# Patient Record
Sex: Female | Born: 1955 | Race: White | Hispanic: No | Marital: Married | Smoking: Never smoker
Health system: Southern US, Community
[De-identification: ages and names within clinical notes are randomized; demographics above are authoritative.]

## PROBLEM LIST (undated history)

## (undated) DIAGNOSIS — E119 Type 2 diabetes mellitus without complications: Secondary | ICD-10-CM

## (undated) DIAGNOSIS — I10 Essential (primary) hypertension: Secondary | ICD-10-CM

## (undated) DIAGNOSIS — Z9889 Other specified postprocedural states: Secondary | ICD-10-CM

## (undated) DIAGNOSIS — E7800 Pure hypercholesterolemia, unspecified: Secondary | ICD-10-CM

## (undated) DIAGNOSIS — T8859XA Other complications of anesthesia, initial encounter: Secondary | ICD-10-CM

## (undated) DIAGNOSIS — M1990 Unspecified osteoarthritis, unspecified site: Secondary | ICD-10-CM

## (undated) DIAGNOSIS — R112 Nausea with vomiting, unspecified: Secondary | ICD-10-CM

## (undated) DIAGNOSIS — T4145XA Adverse effect of unspecified anesthetic, initial encounter: Secondary | ICD-10-CM

## (undated) DIAGNOSIS — T7840XA Allergy, unspecified, initial encounter: Secondary | ICD-10-CM

## (undated) DIAGNOSIS — E039 Hypothyroidism, unspecified: Secondary | ICD-10-CM

## (undated) DIAGNOSIS — N6310 Unspecified lump in the right breast, unspecified quadrant: Secondary | ICD-10-CM

## (undated) DIAGNOSIS — K219 Gastro-esophageal reflux disease without esophagitis: Secondary | ICD-10-CM

## (undated) HISTORY — PX: DILATION AND CURETTAGE OF UTERUS: SHX78

## (undated) HISTORY — PX: BREAST SURGERY: SHX581

## (undated) HISTORY — PX: TUBAL LIGATION: SHX77

## (undated) HISTORY — PX: LIPOMA EXCISION: SHX5283

## (undated) HISTORY — PX: BACK SURGERY: SHX140

---
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
## 2008-01-29 ENCOUNTER — Ambulatory Visit (HOSPITAL_COMMUNITY): Admission: RE | Admit: 2008-01-29 | Discharge: 2008-01-30 | Payer: Self-pay | Admitting: Neurosurgery

## 2008-01-29 ENCOUNTER — Encounter (INDEPENDENT_AMBULATORY_CARE_PROVIDER_SITE_OTHER): Payer: Self-pay | Admitting: Neurosurgery

## 2010-09-19 NOTE — Op Note (Signed)
Carrie Pope, Carrie Pope                ACCOUNT NO.:  [id number]   MEDICAL RECORD NO.:  [id number]          PATIENT TYPE:  OIB   LOCATION:  3534                         FACILITY:  [facility]   PHYSICIAN:  Clydene Fake, M.D.  DATE OF BIRTH:  09/10/1955   DATE OF PROCEDURE:  01/29/2008  DATE OF DISCHARGE:                               OPERATIVE REPORT   PREOPERATIVE DIAGNOSIS:  Herniated nucleus pulposus, right L5-S1.   POSTOPERATIVE DIAGNOSIS:  Herniated nucleus pulposus, right L5-S1.   PROCEDURE:  Right L5-S1 semi-hemilaminectomy and diskectomy,  microdissection with microscope.   SURGEON:  Clydene Fake, MD   ANESTHESIA:  General endotracheal tube anesthesia.   ESTIMATED BLOOD LOSS:  Minimal.   BLOOD GIVEN:  None.   DRAINS:  None.   COMPLICATIONS:  None.   REASON FOR PROCEDURE:  The patient is a 55 year old woman who has been  having back and right leg pain, numbness, also found to some weakness in  right plantar flexion and absent reflex of right ankle.  MRI was done  showing very large disk herniation going caudally L5-S1 causing nerve  compression.  The patient was brought in for decompression.   PROCEDURE IN DETAIL:  The patient was brought in the operating room and  general anesthesia was induced.  The patient was placed in a prone  position on a Wilson frame with all pressure points padded.  The patient  was prepped and draped in sterile fashion.  The site of incision was  injected with 10 mL of 1% lidocaine with epinephrine.  Needle was placed  interspace.  X-ray was obtained showing needle was at the interspace.  Incision was then made at the center where the needle was inserted and  was taken down to fascia and hemostasis was obtained with Bovie  cauterization.  The fascia was incised with the Bovie and subperiosteal  dissection was done over the L5-S1 spinous process and lamina up to the  facet.  Self-retaining retractors were placed.  Marker was placed in the  interspace.  X-ray was obtained showing the marker was at the 5-1  interspace.  A self-retaining retractor was placed and microscope was  brought in for microdissection.  At this point, a high-speed drill was  used to start semihemilaminectomy and medial facetectomy.  It was  completed with Kerrison punches, ligamentum flavum was removed, and  exposed the dura and S1 nerve roots.  A large mass was seen coming  through the axilla between the thecal sac and S1 nerve roots, so we  extended our laminotomy over the sacrum exposing this area more.  We  dissected through this mass and first some gelatinous-like material that  could have been a very edematous, disk was obtained, and we collected  this and sent for pathology as we continued removing the mass, it became  clear that this was an extruded fragment of disk and these pieces also  were capped and we sent a good amount of specimen to pathology just  after this.  We continued the diskectomy through the axilla.  There was  thin membrane or scar  over the nerve root and we dissected through that  and as we did that, we loosened up the S1 nerve root which was a bit  edematous.  We were able to retract the nerve root medially and explored  the disk space.  There was a central annular tear that we could not very  well reach but we were able to remove the rest of free fragmented disk  and we used bipolar 2-0 Vicryl over the annulus.  We did not enter the  disk space.  When we were finished, we had good decompression of the  central canal and the L5-S1 nerve root.  Hemostasis with bipolar  cauterization, Gelfoam, and thrombin.  Gelfoam was irrigated out.  We  irrigated antibiotic solution.  Then we placed Depo-Medrol in the  epidural space.  Retractors were removed.  Fascia closed with 0 Vicryl  interrupted sutures.  Subcutaneous tissue closed with 2-0 and 3-0 Vicryl  interrupted sutures.  Skin closed with Dermabond skin glue and dry  sterile  dressing was placed.  The patient was placed back in the supine  position, awoken from anesthesia, and transferred to recovery room in  stable condition.           ______________________________  Clydene Fake, M.D.     JRH/MEDQ  D:  01/29/2008  T:  01/30/2008  Job:  [id number]

## 2011-02-05 LAB — BASIC METABOLIC PANEL
BUN: 15
CO2: 27
Chloride: 104
Potassium: 3.9

## 2011-02-05 LAB — URINALYSIS, ROUTINE W REFLEX MICROSCOPIC
Glucose, UA: NEGATIVE
pH: 8

## 2011-02-05 LAB — URINE MICROSCOPIC-ADD ON

## 2011-02-05 LAB — CBC
HCT: 41
MCV: 90.2
Platelets: 243
RBC: 4.55
WBC: 8.5

## 2011-02-05 LAB — GLUCOSE, CAPILLARY
Glucose-Capillary: 132 — ABNORMAL HIGH
Glucose-Capillary: 99

## 2019-05-20 ENCOUNTER — Other Ambulatory Visit: Payer: Self-pay | Admitting: Family Medicine

## 2019-05-20 DIAGNOSIS — N6489 Other specified disorders of breast: Secondary | ICD-10-CM

## 2019-06-01 ENCOUNTER — Ambulatory Visit
Admission: RE | Admit: 2019-06-01 | Discharge: 2019-06-01 | Disposition: A | Discharge status: Home / Self Care | Payer: BC Managed Care – PPO | Source: Ambulatory Visit | Attending: Family Medicine | Admitting: Family Medicine

## 2019-06-01 ENCOUNTER — Other Ambulatory Visit: Payer: Self-pay

## 2019-06-01 DIAGNOSIS — N6489 Other specified disorders of breast: Secondary | ICD-10-CM

## 2019-06-17 ENCOUNTER — Other Ambulatory Visit: Payer: Self-pay | Admitting: Surgery

## 2019-06-17 DIAGNOSIS — N6021 Fibroadenosis of right breast: Secondary | ICD-10-CM

## 2019-06-26 ENCOUNTER — Other Ambulatory Visit: Payer: Self-pay | Admitting: Surgery

## 2019-06-26 DIAGNOSIS — N6021 Fibroadenosis of right breast: Secondary | ICD-10-CM

## 2019-07-27 ENCOUNTER — Other Ambulatory Visit: Payer: Self-pay

## 2019-07-27 ENCOUNTER — Encounter (HOSPITAL_BASED_OUTPATIENT_CLINIC_OR_DEPARTMENT_OTHER): Payer: Self-pay | Admitting: Surgery

## 2019-08-01 ENCOUNTER — Other Ambulatory Visit (HOSPITAL_COMMUNITY): Payer: BC Managed Care – PPO

## 2019-08-03 ENCOUNTER — Other Ambulatory Visit (HOSPITAL_COMMUNITY)
Admission: RE | Admit: 2019-08-03 | Discharge: 2019-08-03 | Disposition: A | Discharge status: Home / Self Care | Payer: BC Managed Care – PPO | Source: Ambulatory Visit | Attending: Surgery | Admitting: Surgery

## 2019-08-03 ENCOUNTER — Encounter (HOSPITAL_BASED_OUTPATIENT_CLINIC_OR_DEPARTMENT_OTHER)
Admission: RE | Admit: 2019-08-03 | Discharge: 2019-08-03 | Disposition: A | Discharge status: Home / Self Care | Payer: BC Managed Care – PPO | Source: Ambulatory Visit | Attending: Surgery | Admitting: Surgery

## 2019-08-03 DIAGNOSIS — Z20822 Contact with and (suspected) exposure to covid-19: Secondary | ICD-10-CM | POA: Insufficient documentation

## 2019-08-03 DIAGNOSIS — Z01812 Encounter for preprocedural laboratory examination: Secondary | ICD-10-CM | POA: Diagnosis not present

## 2019-08-03 LAB — BASIC METABOLIC PANEL
Anion gap: 13 (ref 5–15)
BUN: 12 mg/dL (ref 8–23)
CO2: 27 mmol/L (ref 22–32)
Calcium: 9.1 mg/dL (ref 8.9–10.3)
Chloride: 99 mmol/L (ref 98–111)
Creatinine, Ser: 0.69 mg/dL (ref 0.44–1.00)
GFR calc Af Amer: >60 mL/min (ref >60)
GFR calc non Af Amer: >60 mL/min (ref >60)
Glucose, Bld: 148 mg/dL — ABNORMAL HIGH (ref 70–99)
Potassium: 4 mmol/L (ref 3.5–5.1)
Sodium: 139 mmol/L (ref 135–145)

## 2019-08-03 LAB — SARS CORONAVIRUS 2 (TAT 6-24 HRS): SARS Coronavirus 2: NEGATIVE

## 2019-08-03 MED ORDER — ENSURE PRE-SURGERY PO LIQD: 296.0000 mL | Freq: Once | ORAL | Status: DC

## 2019-08-03 NOTE — Progress Notes (Signed)

## 2019-08-03 NOTE — Progress Notes (Signed)
EKG reviewed by Dr. Witman, will proceed with surgery as scheduled. 

## 2019-08-04 ENCOUNTER — Other Ambulatory Visit: Payer: Self-pay

## 2019-08-04 ENCOUNTER — Ambulatory Visit
Admission: RE | Admit: 2019-08-04 | Discharge: 2019-08-04 | Disposition: A | Discharge status: Home / Self Care | Payer: BC Managed Care – PPO | Source: Ambulatory Visit | Attending: Surgery | Admitting: Surgery

## 2019-08-04 DIAGNOSIS — N6021 Fibroadenosis of right breast: Secondary | ICD-10-CM

## 2019-08-04 NOTE — H&P (Signed)
Carrie Pope  Location: [facility] Patient #: [id number] DOB: 1955-12-29 Married / Language: English / Race: White Female   History of Present Illness  The patient is a 64 year old female who presents with a complaint of Breast problems. This patient is referred by Dr. Dimas Aguas after the recent diagnosis of an abnormality in the right breast. She had a architectural distortion seen on recent for the right breast. She underwent diagnostic films and a stereotactic biopsy which showed a complex sclerosing lesion. Surgical excision of this area is highly recommended. She's had a previous benign biopsy in the left breast many years ago. She has no family history of breast cancer. She is otherwise fairly healthy and without complaints. She denies nipple discharge or any problems with her breasts.   Past Surgical History  Breast Biopsy  Left. Spinal Surgery - Lower Back   Diagnostic Studies History   Colonoscopy  >10 years ago Mammogram  within last year Pap Smear  >5 years ago  Allergies Doristine Devoid, CMA;  No Known Drug Allergies [06/17/2019]:  Medication History Doristine Devoid, CMA;  Atorvastatin Calcium (40MG  Tablet, Oral) Active. Levothyroxine Sodium ( Tablet, Oral) Active. Losartan Potassium-HCTZ (100-25MG  Tablet, Oral) Active. metFORMIN HCl ER (500MG  Tablet ER 24HR, Oral) Active. Metoprolol Succinate ER (50MG  Tablet ER 24HR, Oral) Active. Trulicity (1.5MG /0.5ML Soln Pen-inj, Subcutaneous) Active. Magnesium (250MG  Tablet, Oral) Active. Calcium (600-200MG -UNIT Tablet, Oral) Active. CoQ10 (50MG  Capsule, Oral) Active. St Johns Wort (300MG  Capsule, Oral) Active. Medications Reconciled  Social History , CMA; Alcohol use  Occasional alcohol use. Caffeine use  Coffee, Tea. Illicit drug use  Remotely quit drug use. Tobacco use  Former smoker.  Family History , CMA; Anesthetic complications  Sister. Diabetes  Mellitus  Mother. Heart Disease  Father. Heart disease in female family member before age 21  Melanoma  Sister. Thyroid problems  Brother, Sister.  Pregnancy / Birth History , CMA; Age at menarche  14 years. Age of menopause  >61 Contraceptive History  Oral contraceptives. Gravida  2 Maternal age  14-30 Para  1  Other Problems (Chemira Jones, CMA;  Diabetes Mellitus  Gastroesophageal Reflux Disease  General anesthesia - complications  High blood pressure  Hypercholesterolemia  Lump In Breast  Thyroid Disease     Review of Systems (Chemira Jones CMA;  General Not Present- Appetite Loss, Chills, Fatigue, Fever, Night Sweats, Weight Gain and Weight Loss. Skin Not Present- Change in Wart/Mole, Dryness, Hives, Jaundice, New Lesions, Non-Healing Wounds, Rash and Ulcer. HEENT Present- Wears glasses/contact lenses. Not Present- Earache, Hearing Loss, Hoarseness, Nose Bleed, Oral Ulcers, Ringing in the Ears, Seasonal Allergies, Sinus Pain, Sore Throat, Visual Disturbances and Yellow Eyes. Respiratory Not Present- Bloody sputum, Chronic Cough, Difficulty Breathing, Snoring and Wheezing. Breast Present- Breast Mass. Not Present- Breast Pain, Nipple Discharge and Skin Changes. Cardiovascular Not Present- Chest Pain, Difficulty Breathing Lying Down, Leg Cramps, Palpitations, Rapid Heart Rate, Shortness of Breath and Swelling of Extremities. Gastrointestinal Not Present- Abdominal Pain, Bloating, Bloody Stool, Change in Bowel Habits, Chronic diarrhea, Constipation, Difficulty Swallowing, Excessive gas, Gets full quickly at meals, Hemorrhoids, Indigestion, Nausea, Rectal Pain and Vomiting. Female Genitourinary Not Present- Frequency, Nocturia, Painful Urination, Pelvic Pain and Urgency. Musculoskeletal Not Present- Back Pain, Joint Pain, Joint Stiffness, Muscle Pain, Muscle Weakness and Swelling of Extremities. Neurological Not Present- Decreased Memory, Fainting,  Headaches, Numbness, Seizures, Tingling, Tremor, Trouble walking and Weakness. Psychiatric Not Present- Anxiety, Bipolar, Change in Sleep Pattern, Depression, Fearful and Frequent crying. Endocrine Not  Present- Cold Intolerance, Excessive Hunger, Hair Changes, Heat Intolerance, Hot flashes and New Diabetes. Hematology Not Present- Blood Thinners, Easy Bruising, Excessive bleeding, Gland problems, HIV and Persistent Infections.  Vitals   Weight: 152.2 lb Height: 65in Body Surface Area: 1.76 m Body Mass Index: 25.33 kg/m  Temp.: 57F  BP: 128/88 (Sitting, Left Arm, Standard)       Physical Exam The physical exam findings are as follows: Note:She appears well exam  There is some ecchymosis in the right breast at the 12 o'clock position. Her biopsy needle site is well-healed. There are no palpable masses. The nipple areolar complex is normal. There is no axillary or supraclavicular adenopathy.  I reviewed her mammogram and ultrasound. I reviewed the pathology results as well.    Assessment & Plan   SCLEROSING ADENOSIS OF BREAST, RIGHT (N60.21)  Impression: This is a patient with a complex sclerosing lesion of the right breast. I gave her a copy of the pathology results. We discussed the diagnosis in detail. We discussed the reasons for surgical excision of this area. I would recommend a radioactive seed guided right breast lumpectomy. I discussed the surgical procedure in detail. I discussed the risk which includes but is not limited to bleeding, infection, the need for further procedures of malignancy is found, cardiopulmonary issues, postoperative recovery, etc. After discussion, she wished to proceed with surgery which will be scheduled  This patient encounter took 30 minutes today to perform the following: take history, perform exam, review outside records, interpret imaging, counsel the patient on their diagnosis and document encounter, findings & plan in the  EHR

## 2019-08-05 ENCOUNTER — Encounter (HOSPITAL_BASED_OUTPATIENT_CLINIC_OR_DEPARTMENT_OTHER)
Admission: RE | Disposition: A | Discharge status: Home / Self Care | Payer: Self-pay | Source: Home / Self Care | Attending: Surgery

## 2019-08-05 ENCOUNTER — Ambulatory Visit (HOSPITAL_BASED_OUTPATIENT_CLINIC_OR_DEPARTMENT_OTHER): Payer: BC Managed Care – PPO | Admitting: Anesthesiology

## 2019-08-05 ENCOUNTER — Ambulatory Visit (HOSPITAL_BASED_OUTPATIENT_CLINIC_OR_DEPARTMENT_OTHER)
Admission: RE | Admit: 2019-08-05 | Discharge: 2019-08-05 | Disposition: A | Discharge status: Home / Self Care | Payer: BC Managed Care – PPO | Attending: Surgery | Admitting: Surgery

## 2019-08-05 ENCOUNTER — Ambulatory Visit
Admission: RE | Admit: 2019-08-05 | Discharge: 2019-08-05 | Disposition: A | Discharge status: Home / Self Care | Payer: BC Managed Care – PPO | Source: Ambulatory Visit | Attending: Surgery | Admitting: Surgery

## 2019-08-05 ENCOUNTER — Encounter (HOSPITAL_BASED_OUTPATIENT_CLINIC_OR_DEPARTMENT_OTHER): Payer: Self-pay | Admitting: Surgery

## 2019-08-05 DIAGNOSIS — Z7984 Long term (current) use of oral hypoglycemic drugs: Secondary | ICD-10-CM | POA: Insufficient documentation

## 2019-08-05 DIAGNOSIS — Z833 Family history of diabetes mellitus: Secondary | ICD-10-CM | POA: Diagnosis not present

## 2019-08-05 DIAGNOSIS — E7800 Pure hypercholesterolemia, unspecified: Secondary | ICD-10-CM | POA: Insufficient documentation

## 2019-08-05 DIAGNOSIS — Z87891 Personal history of nicotine dependence: Secondary | ICD-10-CM | POA: Insufficient documentation

## 2019-08-05 DIAGNOSIS — N6021 Fibroadenosis of right breast: Secondary | ICD-10-CM

## 2019-08-05 DIAGNOSIS — E119 Type 2 diabetes mellitus without complications: Secondary | ICD-10-CM | POA: Insufficient documentation

## 2019-08-05 DIAGNOSIS — N6489 Other specified disorders of breast: Secondary | ICD-10-CM | POA: Diagnosis present

## 2019-08-05 DIAGNOSIS — Z79899 Other long term (current) drug therapy: Secondary | ICD-10-CM | POA: Insufficient documentation

## 2019-08-05 DIAGNOSIS — Z79890 Hormone replacement therapy (postmenopausal): Secondary | ICD-10-CM | POA: Diagnosis not present

## 2019-08-05 DIAGNOSIS — I10 Essential (primary) hypertension: Secondary | ICD-10-CM | POA: Diagnosis not present

## 2019-08-05 HISTORY — DX: Unspecified osteoarthritis, unspecified site: M19.90

## 2019-08-05 HISTORY — DX: Essential (primary) hypertension: I10

## 2019-08-05 HISTORY — DX: Type 2 diabetes mellitus without complications: E11.9

## 2019-08-05 HISTORY — DX: Other specified postprocedural states: R11.2

## 2019-08-05 HISTORY — PX: BREAST LUMPECTOMY WITH RADIOACTIVE SEED LOCALIZATION: SHX6424

## 2019-08-05 HISTORY — DX: Hypothyroidism, unspecified: E03.9

## 2019-08-05 HISTORY — DX: Allergy, unspecified, initial encounter: T78.40XA

## 2019-08-05 HISTORY — DX: Other complications of anesthesia, initial encounter: T88.59XA

## 2019-08-05 HISTORY — DX: Unspecified lump in the right breast, unspecified quadrant: N63.10

## 2019-08-05 HISTORY — DX: Pure hypercholesterolemia, unspecified: E78.00

## 2019-08-05 HISTORY — DX: Gastro-esophageal reflux disease without esophagitis: K21.9

## 2019-08-05 HISTORY — DX: Other specified postprocedural states: Z98.890

## 2019-08-05 LAB — GLUCOSE, CAPILLARY
Glucose-Capillary: 163 mg/dL — ABNORMAL HIGH (ref 70–99)
Glucose-Capillary: 162 mg/dL — ABNORMAL HIGH (ref 70–99)

## 2019-08-05 SURGERY — BREAST LUMPECTOMY WITH RADIOACTIVE SEED LOCALIZATION
Anesthesia: General | Site: Breast | Laterality: Right

## 2019-08-05 MED ORDER — GABAPENTIN 300 MG PO CAPS
ORAL_CAPSULE | ORAL | Status: AC
  Filled 2019-08-05: qty 1

## 2019-08-05 MED ORDER — DEXAMETHASONE SODIUM PHOSPHATE 10 MG/ML IJ SOLN
INTRAMUSCULAR | Status: AC
  Filled 2019-08-05: qty 1

## 2019-08-05 MED ORDER — ONDANSETRON HCL 4 MG/2ML IJ SOLN
INTRAMUSCULAR | Status: AC
  Filled 2019-08-05: qty 2

## 2019-08-05 MED ORDER — OXYCODONE HCL 5 MG PO TABS: 5.0000 mg | ORAL_TABLET | Freq: Once | ORAL | Status: DC | PRN

## 2019-08-05 MED ORDER — ONDANSETRON HCL 4 MG/2ML IJ SOLN: 4.0000 mg | Freq: Once | INTRAMUSCULAR | Status: DC | PRN

## 2019-08-05 MED ORDER — MIDAZOLAM HCL 2 MG/2ML IJ SOLN
INTRAMUSCULAR | Status: AC
  Filled 2019-08-05: qty 2

## 2019-08-05 MED ORDER — 0.9 % SODIUM CHLORIDE (POUR BTL) OPTIME
TOPICAL | Status: DC | PRN
  Administered 2019-08-05: 1000 mL

## 2019-08-05 MED ORDER — OXYCODONE HCL 5 MG/5ML PO SOLN: 5.0000 mg | Freq: Once | ORAL | Status: DC | PRN

## 2019-08-05 MED ORDER — LIDOCAINE 2% (20 MG/ML) 5 ML SYRINGE
INTRAMUSCULAR | Status: AC
  Filled 2019-08-05: qty 5

## 2019-08-05 MED ORDER — BUPIVACAINE HCL (PF) 0.5 % IJ SOLN
INTRAMUSCULAR | Status: DC | PRN
  Administered 2019-08-05: 10 mL

## 2019-08-05 MED ORDER — ACETAMINOPHEN 500 MG PO TABS
ORAL_TABLET | ORAL | Status: AC
  Filled 2019-08-05: qty 2

## 2019-08-05 MED ORDER — DIPHENHYDRAMINE HCL 50 MG/ML IJ SOLN
INTRAMUSCULAR | Status: AC
  Filled 2019-08-05: qty 1

## 2019-08-05 MED ORDER — FENTANYL CITRATE (PF) 100 MCG/2ML IJ SOLN
50.0000 ug | INTRAMUSCULAR | Status: DC | PRN
  Administered 2019-08-05: 100 ug via INTRAVENOUS

## 2019-08-05 MED ORDER — LACTATED RINGERS IV SOLN: INTRAVENOUS | Status: DC

## 2019-08-05 MED ORDER — PHENYLEPHRINE 40 MCG/ML (10ML) SYRINGE FOR IV PUSH (FOR BLOOD PRESSURE SUPPORT)
PREFILLED_SYRINGE | INTRAVENOUS | Status: AC
  Filled 2019-08-05: qty 10

## 2019-08-05 MED ORDER — LIDOCAINE 2% (20 MG/ML) 5 ML SYRINGE
INTRAMUSCULAR | Status: DC | PRN
  Administered 2019-08-05: 60 mg via INTRAVENOUS

## 2019-08-05 MED ORDER — CELECOXIB 200 MG PO CAPS
ORAL_CAPSULE | ORAL | Status: AC
  Filled 2019-08-05: qty 2

## 2019-08-05 MED ORDER — CEFAZOLIN SODIUM-DEXTROSE 2-4 GM/100ML-% IV SOLN
2.0000 g | INTRAVENOUS | Status: AC
  Administered 2019-08-05: 2 g via INTRAVENOUS

## 2019-08-05 MED ORDER — TRAMADOL HCL 50 MG PO TABS: 50.0000 mg | ORAL_TABLET | Freq: Four times a day (QID) | ORAL | 0 refills | Status: AC | PRN

## 2019-08-05 MED ORDER — ACETAMINOPHEN 500 MG PO TABS
1000.0000 mg | ORAL_TABLET | ORAL | Status: AC
  Administered 2019-08-05: 1000 mg via ORAL

## 2019-08-05 MED ORDER — FENTANYL CITRATE (PF) 100 MCG/2ML IJ SOLN: 25.0000 ug | INTRAMUSCULAR | Status: DC | PRN

## 2019-08-05 MED ORDER — SCOPOLAMINE 1 MG/3DAYS TD PT72
MEDICATED_PATCH | TRANSDERMAL | Status: AC
  Filled 2019-08-05: qty 1

## 2019-08-05 MED ORDER — DIPHENHYDRAMINE HCL 50 MG/ML IJ SOLN
INTRAMUSCULAR | Status: DC | PRN
  Administered 2019-08-05: 6.25 mg via INTRAVENOUS

## 2019-08-05 MED ORDER — CELECOXIB 400 MG PO CAPS
400.0000 mg | ORAL_CAPSULE | ORAL | Status: AC
  Administered 2019-08-05: 400 mg via ORAL

## 2019-08-05 MED ORDER — DEXAMETHASONE SODIUM PHOSPHATE 4 MG/ML IJ SOLN
INTRAMUSCULAR | Status: DC | PRN
  Administered 2019-08-05: 5 mg via INTRAVENOUS

## 2019-08-05 MED ORDER — CHLORHEXIDINE GLUCONATE CLOTH 2 % EX PADS: 6.0000 | MEDICATED_PAD | Freq: Once | CUTANEOUS | Status: DC

## 2019-08-05 MED ORDER — FENTANYL CITRATE (PF) 100 MCG/2ML IJ SOLN
INTRAMUSCULAR | Status: AC
  Filled 2019-08-05: qty 2

## 2019-08-05 MED ORDER — SUCCINYLCHOLINE CHLORIDE 200 MG/10ML IV SOSY
PREFILLED_SYRINGE | INTRAVENOUS | Status: AC
  Filled 2019-08-05: qty 10

## 2019-08-05 MED ORDER — MIDAZOLAM HCL 2 MG/2ML IJ SOLN
1.0000 mg | INTRAMUSCULAR | Status: DC | PRN
  Administered 2019-08-05: 2 mg via INTRAVENOUS

## 2019-08-05 MED ORDER — GABAPENTIN 300 MG PO CAPS
300.0000 mg | ORAL_CAPSULE | ORAL | Status: AC
  Administered 2019-08-05: 300 mg via ORAL

## 2019-08-05 MED ORDER — SCOPOLAMINE 1 MG/3DAYS TD PT72
1.0000 | MEDICATED_PATCH | TRANSDERMAL | Status: DC
  Administered 2019-08-05: 1.5 mg via TRANSDERMAL

## 2019-08-05 MED ORDER — CEFAZOLIN SODIUM-DEXTROSE 2-4 GM/100ML-% IV SOLN
INTRAVENOUS | Status: AC
  Filled 2019-08-05: qty 100

## 2019-08-05 MED ORDER — ONDANSETRON HCL 4 MG/2ML IJ SOLN
INTRAMUSCULAR | Status: DC | PRN
  Administered 2019-08-05: 4 mg via INTRAVENOUS

## 2019-08-05 MED ORDER — EPHEDRINE 5 MG/ML INJ
INTRAVENOUS | Status: AC
  Filled 2019-08-05: qty 10

## 2019-08-05 SURGICAL SUPPLY — 49 items
APPLIER CLIP 9.375 MED OPEN (MISCELLANEOUS)
BINDER BREAST 3XL (GAUZE/BANDAGES/DRESSINGS) IMPLANT
BINDER BREAST LRG (GAUZE/BANDAGES/DRESSINGS) ×3 IMPLANT
BINDER BREAST MEDIUM (GAUZE/BANDAGES/DRESSINGS) IMPLANT
BINDER BREAST XLRG (GAUZE/BANDAGES/DRESSINGS) IMPLANT
BINDER BREAST XXLRG (GAUZE/BANDAGES/DRESSINGS) IMPLANT
BLADE SURG 15 STRL LF DISP TIS (BLADE) ×1 IMPLANT
BLADE SURG 15 STRL SS (BLADE) ×2
CANISTER SUC SOCK COL 7IN (MISCELLANEOUS) IMPLANT
CANISTER SUCT 1200ML W/VALVE (MISCELLANEOUS) IMPLANT
CHLORAPREP W/TINT 26 (MISCELLANEOUS) ×3 IMPLANT
CLIP APPLIE 9.375 MED OPEN (MISCELLANEOUS) IMPLANT
COVER BACK TABLE 60X90IN (DRAPES) ×3 IMPLANT
COVER MAYO STAND STRL (DRAPES) ×3 IMPLANT
COVER PROBE W GEL 5X96 (DRAPES) ×3 IMPLANT
COVER WAND RF STERILE (DRAPES) IMPLANT
DECANTER SPIKE VIAL GLASS SM (MISCELLANEOUS) IMPLANT
DERMABOND ADVANCED (GAUZE/BANDAGES/DRESSINGS) ×2
DERMABOND ADVANCED .7 DNX12 (GAUZE/BANDAGES/DRESSINGS) ×1 IMPLANT
DRAPE LAPAROSCOPIC ABDOMINAL (DRAPES) ×3 IMPLANT
DRAPE UTILITY XL STRL (DRAPES) ×3 IMPLANT
ELECT REM PT RETURN 9FT ADLT (ELECTROSURGICAL) ×3
ELECTRODE REM PT RTRN 9FT ADLT (ELECTROSURGICAL) ×1 IMPLANT
GAUZE SPONGE 4X4 12PLY STRL LF (GAUZE/BANDAGES/DRESSINGS) IMPLANT
GLOVE BIOGEL PI IND STRL 7.0 (GLOVE) ×2 IMPLANT
GLOVE BIOGEL PI INDICATOR 7.0 (GLOVE) ×4
GLOVE SURG SIGNA 7.5 PF LTX (GLOVE) ×3 IMPLANT
GLOVE SURG SS PI 7.0 STRL IVOR (GLOVE) ×3 IMPLANT
GOWN STRL REUS W/ TWL LRG LVL3 (GOWN DISPOSABLE) ×1 IMPLANT
GOWN STRL REUS W/ TWL XL LVL3 (GOWN DISPOSABLE) ×1 IMPLANT
GOWN STRL REUS W/TWL LRG LVL3 (GOWN DISPOSABLE) ×2
GOWN STRL REUS W/TWL XL LVL3 (GOWN DISPOSABLE) ×2
KIT MARKER MARGIN INK (KITS) ×3 IMPLANT
NEEDLE HYPO 25X1 1.5 SAFETY (NEEDLE) ×3 IMPLANT
NS IRRIG 1000ML POUR BTL (IV SOLUTION) IMPLANT
PACK BASIN DAY SURGERY FS (CUSTOM PROCEDURE TRAY) ×3 IMPLANT
PENCIL SMOKE EVACUATOR (MISCELLANEOUS) ×3 IMPLANT
SLEEVE SCD COMPRESS KNEE MED (MISCELLANEOUS) ×3 IMPLANT
SPONGE LAP 4X18 RFD (DISPOSABLE) ×3 IMPLANT
SUT MNCRL AB 4-0 PS2 18 (SUTURE) ×3 IMPLANT
SUT SILK 2 0 SH (SUTURE) IMPLANT
SUT VIC AB 3-0 SH 27 (SUTURE) ×2
SUT VIC AB 3-0 SH 27X BRD (SUTURE) ×1 IMPLANT
SYR CONTROL 10ML LL (SYRINGE) ×3 IMPLANT
TOWEL GREEN STERILE FF (TOWEL DISPOSABLE) ×3 IMPLANT
TRAY FAXITRON CT DISP (TRAY / TRAY PROCEDURE) ×3 IMPLANT
TUBE CONNECTING 20'X1/4 (TUBING)
TUBE CONNECTING 20X1/4 (TUBING) IMPLANT
YANKAUER SUCT BULB TIP NO VENT (SUCTIONS) IMPLANT

## 2019-08-05 NOTE — Anesthesia Preprocedure Evaluation (Signed)
Anesthesia Evaluation  Patient identified by MRN, date of birth, ID band Patient awake    Reviewed: Allergy & Precautions, NPO status , Patient's Chart, lab work & pertinent test results, reviewed documented beta blocker date and time   History of Anesthesia Complications (+) PONVNegative for: history of anesthetic complications  Airway Mallampati: I  TM Distance: >3 FB Neck ROM: Full    Dental  (+) Teeth Intact   Pulmonary neg pulmonary ROS,    Pulmonary exam normal        Cardiovascular hypertension, Pt. on medications and Pt. on home beta blockers Normal cardiovascular exam     Neuro/Psych negative neurological ROS  negative psych ROS   GI/Hepatic Neg liver ROS, GERD  ,  Endo/Other  diabetes, Oral Hypoglycemic AgentsHypothyroidism   Renal/GU negative Renal ROS  negative genitourinary   Musculoskeletal negative musculoskeletal ROS (+)   Abdominal   Peds  Hematology negative hematology ROS (+)   Anesthesia Other Findings  COMPLEX SCLEROSING LESION RIGHT BREAST  Reproductive/Obstetrics                            Anesthesia Physical Anesthesia Plan  ASA: II  Anesthesia Plan: General   Post-op Pain Management:    Induction: Intravenous  PONV Risk Score and Plan: 4 or greater and Ondansetron, Dexamethasone, Midazolam and Treatment may vary due to age or medical condition  Airway Management Planned: LMA  Additional Equipment: None  Intra-op Plan:   Post-operative Plan: Extubation in OR  Informed Consent: I have reviewed the patients History and Physical, chart, labs and discussed the procedure including the risks, benefits and alternatives for the proposed anesthesia with the patient or authorized representative who has indicated his/her understanding and acceptance.     Dental advisory given  Plan Discussed with:   Anesthesia Plan Comments:         Anesthesia Quick  Evaluation

## 2019-08-05 NOTE — Op Note (Signed)
RIGHT BREAST LUMPECTOMY WITH RADIOACTIVE SEED LOCALIZATION  Procedure Note  Carrie Pope 08/05/2019   Pre-op Diagnosis: COMPLEX SCLEROSING LESION RIGHT BREAST     Post-op Diagnosis: same  Procedure(s): RIGHT BREAST LUMPECTOMY WITH RADIOACTIVE SEED LOCALIZATION  Surgeon(s): Abigail Miyamoto, MD  Anesthesia: General  Staff:  Circulator: Ocie Bob, RN Relief Circulator: Randalyn Rhea, RN Scrub Person: Verdie Drown  Estimated Blood Loss: Minimal               Specimens: sent to path  Indications: This is a 65 year old female who was found to have an abnormality in the right breast on screening mammography.  She underwent biopsy of this showing fibrocystic changes with sclerosing adenosis and fibroadenoma.  This was felt to be discordant.  Complete surgical excision of this area was recommended with a radioactive seed guided lumpectomy  Procedure: The patient was brought to operating identifies correct patient.  She is placed upon the operating table general anesthesia was induced.  Her right breast was then prepped and draped in usual sterile fashion.  With the aid of the neoprobe I located the seed at about the 11:30 position several inches from the nipple areolar complex.  I anesthetized the skin around the areola with Marcaine and then made a circumareolar incision with a scalpel.  With the aid of neoprobe I then dissected superiorly toward the radioactive seed.  I then performed a lumpectomy staying around the seed with the electrocautery going down near the chest wall.  Once the specimen was removed, I marked all margins with paint and then x-rayed the specimen.  This confirmed that the previous tissue marker and radioactive seed were in the specimen.  This was then sent to pathology for evaluation.  I achieved hemostasis with cautery.  I anesthetized incision further with Marcaine.  I then closed the subtenons tissue with interrupted 3-0 Vicryl sutures and closed  skin with a running 4-0 Monocryl.  Dermabond was then applied.  The patient tolerated procedure well.  All counts were correct at the end of the procedure.  The patient was then extubated in the operating room and taken in a stable condition to the recovery room.          Abigail Miyamoto   Date: 08/05/2019  Time: 9:06 AM

## 2019-08-05 NOTE — Transfer of Care (Signed)
Immediate Anesthesia Transfer of Care Note  Patient: Carrie Pope  Procedure(s) Performed: RIGHT BREAST LUMPECTOMY WITH RADIOACTIVE SEED LOCALIZATION (Right Breast)  Patient Location: PACU  Anesthesia Type:General  Level of Consciousness: awake, alert , oriented and drowsy  Airway & Oxygen Therapy: Patient Spontanous Breathing and Patient connected to face mask oxygen  Post-op Assessment: Report given to RN and Post -op Vital signs reviewed and stable  Post vital signs: Reviewed and stable  Last Vitals:  Vitals Value Taken Time  BP    Temp    Pulse 70 08/05/19 0912  Resp 9 08/05/19 0912  SpO2 100 % 08/05/19 0912  Vitals shown include unvalidated device data.  Last Pain:  Vitals:   08/05/19 0743  TempSrc: Temporal  PainSc: 0-No pain         Complications: No apparent anesthesia complications

## 2019-08-05 NOTE — Discharge Instructions (Signed)
Next dose of Tylenol and Ibuprofen at 1:30 PM    [employer] Phone Number [phone]  BREAST BIOPSY/ PARTIAL MASTECTOMY: POST OP INSTRUCTIONS  Always review your discharge instruction sheet given to you by the facility where your surgery was performed.  IF YOU HAVE DISABILITY OR FAMILY LEAVE FORMS, YOU MUST BRING THEM TO THE OFFICE FOR PROCESSING.  DO NOT GIVE THEM TO YOUR DOCTOR.  1. A prescription for pain medication may be given to you upon discharge.  Take your pain medication as prescribed, if needed.  If narcotic pain medicine is not needed, then you may take acetaminophen (Tylenol) or ibuprofen (Advil) as needed. 2. Take your usually prescribed medications unless otherwise directed 3. If you need a refill on your pain medication, please contact your pharmacy.  They will contact our office to request authorization.  Prescriptions will not be filled after 5pm or on week-ends. 4. You should eat very light the first 24 hours after surgery, such as soup, crackers, pudding, etc.  Resume your normal diet the day after surgery. 5. Most patients will experience some swelling and bruising in the breast.  Ice packs and a good support bra will help.  Swelling and bruising can take several days to resolve.  6. It is common to experience some constipation if taking pain medication after surgery.  Increasing fluid intake and taking a stool softener will usually help or prevent this problem from occurring.  A mild laxative (Milk of Magnesia or Miralax) should be taken according to package directions if there are no bowel movements after 48 hours. 7. Unless discharge instructions indicate otherwise, you may remove your bandages 24-48 hours after surgery, and you may shower at that time.  You may have steri-strips (small skin tapes) in place directly over the incision.  These strips should be left on the skin for 7-10 days.  If your surgeon used skin glue on the incision, you may  shower in 24 hours.  The glue will flake off over the next 2-3 weeks.  Any sutures or staples will be removed at the office during your follow-up visit. 8. ACTIVITIES:  You may resume regular daily activities (gradually increasing) beginning the next day.  Wearing a good support bra or sports bra minimizes pain and swelling.  You may have sexual intercourse when it is comfortable. a. You may drive when you no longer are taking prescription pain medication, you can comfortably wear a seatbelt, and you can safely maneuver your car and apply brakes. b. RETURN TO WORK:  ______________________________________________________________________________________ 9. You should see your doctor in the office for a follow-up appointment approximately two weeks after your surgery.  Your doctors nurse will typically make your follow-up appointment when she calls you with your pathology report.  Expect your pathology report 2-3 business days after your surgery.  You may call to check if you do not hear from [place] after three days. 10. OTHER INSTRUCTIONS:OK TO SHOWER STARTING TOMORROW 11. ICE PACK, TYLENOL, IBUPROFEN ALSO FOR PAIN 12. NO VIGOROUS ACTIVITY FOR ONE WEEK _______________________________________________________________________________________________ _____________________________________________________________________________________________________________________________________ _____________________________________________________________________________________________________________________________________ _____________________________________________________________________________________________________________________________________  WHEN TO CALL YOUR DOCTOR: 1. Fever over 101.0 2. Nausea and/or vomiting. 3. Extreme swelling or bruising. 4. Continued bleeding from incision. 5. Increased pain, redness, or drainage from the incision.  The clinic staff is available to answer your questions during  regular business hours.  Please dont hesitate to call and ask to speak to one of the nurses for clinical concerns.  If you have a medical emergency,  go to the nearest emergency room or call 911.  A surgeon from [facility] is always on call at the hospital.  For further questions, please visit [email or web address]     Post Anesthesia Home Care Instructions  Activity: Get plenty of rest for the remainder of the day. A responsible individual must stay with you for 24 hours following the procedure.  For the next 24 hours, DO NOT: -Drive a car -Advertising copywriter -Drink alcoholic beverages -Take any medication unless instructed by your physician -Make any legal decisions or sign important papers.  Meals: Start with liquid foods such as gelatin or soup. Progress to regular foods as tolerated. Avoid greasy, spicy, heavy foods. If nausea and/or vomiting occur, drink only clear liquids until the nausea and/or vomiting subsides. Call your physician if vomiting continues.  Special Instructions/Symptoms: Your throat may feel dry or sore from the anesthesia or the breathing tube placed in your throat during surgery. If this causes discomfort, gargle with warm salt water. The discomfort should disappear within 24 hours.  If you had a scopolamine patch placed behind your ear for the management of post- operative nausea and/or vomiting:  1. The medication in the patch is effective for 72 hours, after which it should be removed.  Wrap patch in a tissue and discard in the trash. Wash hands thoroughly with soap and water. 2. You may remove the patch earlier than 72 hours if you experience unpleasant side effects which may include dry mouth, dizziness or visual disturbances. 3. Avoid touching the patch. Wash your hands with soap and water after contact with the patch.

## 2019-08-05 NOTE — Anesthesia Procedure Notes (Signed)
Procedure Name: LMA Insertion Date/Time: 08/05/2019 8:33 AM Performed by: Ronnette Hila, CRNA Pre-anesthesia Checklist: Patient identified, Emergency Drugs available, Suction available and Patient being monitored Patient Re-evaluated:Patient Re-evaluated prior to induction Oxygen Delivery Method: Circle system utilized Preoxygenation: Pre-oxygenation with 100% oxygen Induction Type: IV induction Ventilation: Mask ventilation without difficulty LMA: LMA inserted LMA Size: 4.0 Number of attempts: 1 Airway Equipment and Method: Bite block Placement Confirmation: positive ETCO2 Tube secured with: Tape Dental Injury: Teeth and Oropharynx as per pre-operative assessment

## 2019-08-05 NOTE — Interval H&P Note (Signed)
History and Physical Interval Note: no change in H and P  08/05/2019 8:10 AM  Carrie Pope  has presented today for surgery, with the diagnosis of COMPLEX SCLEROSING LESION RIGHT BREAST.  The various methods of treatment have been discussed with the patient and family. After consideration of risks, benefits and other options for treatment, the patient has consented to  Procedure(s): RIGHT BREAST LUMPECTOMY WITH RADIOACTIVE SEED LOCALIZATION (Right) as a surgical intervention.  The patient's history has been reviewed, patient examined, no change in status, stable for surgery.  I have reviewed the patient's chart and labs.  Questions were answered to the patient's satisfaction.     Abigail Miyamoto

## 2019-08-06 LAB — SURGICAL PATHOLOGY

## 2019-08-06 NOTE — Anesthesia Postprocedure Evaluation (Signed)
Anesthesia Post Note  Patient: Carrie Pope  Procedure(s) Performed: RIGHT BREAST LUMPECTOMY WITH RADIOACTIVE SEED LOCALIZATION (Right Breast)     Patient location during evaluation: PACU Anesthesia Type: General Level of consciousness: awake and alert Pain management: pain level controlled Vital Signs Assessment: post-procedure vital signs reviewed and stable Respiratory status: spontaneous breathing, nonlabored ventilation and respiratory function stable Cardiovascular status: blood pressure returned to baseline and stable Postop Assessment: no apparent nausea or vomiting Anesthetic complications: no    Last Vitals:  Vitals:   08/05/19 0940 08/05/19 1015  BP: 120/64 115/90  Pulse: 62 74  Resp: 12 16  Temp:  36.6 C  SpO2: 97% 97%    Last Pain:  Vitals:   08/06/19 1025  TempSrc:   PainSc: 1                  Lucretia Kern

## 2020-08-26 ENCOUNTER — Encounter (HOSPITAL_COMMUNITY): Payer: Self-pay

## 2022-03-30 IMAGING — [facility] BREAST LOC DEV 1ST LESION INC*R*
8 series · 8 of 8 positions shown · non-contrast
Comparison: Previous exam(s).

CLINICAL DATA: Patient presents for radioactive seed localization
of the RIGHT breast prior to excision for discordant benign biopsy.

EXAM:
MAMMOGRAPHIC GUIDED RADIOACTIVE SEED LOCALIZATION OF THE RIGHT
BREAST

[R ML (1 of 4)]
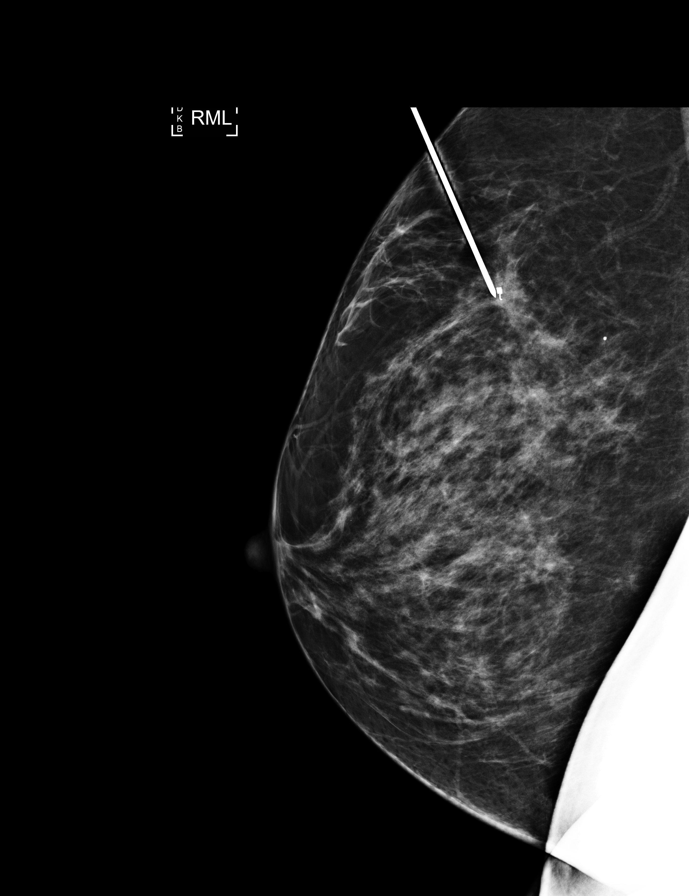

[R CC (1 of 4)]
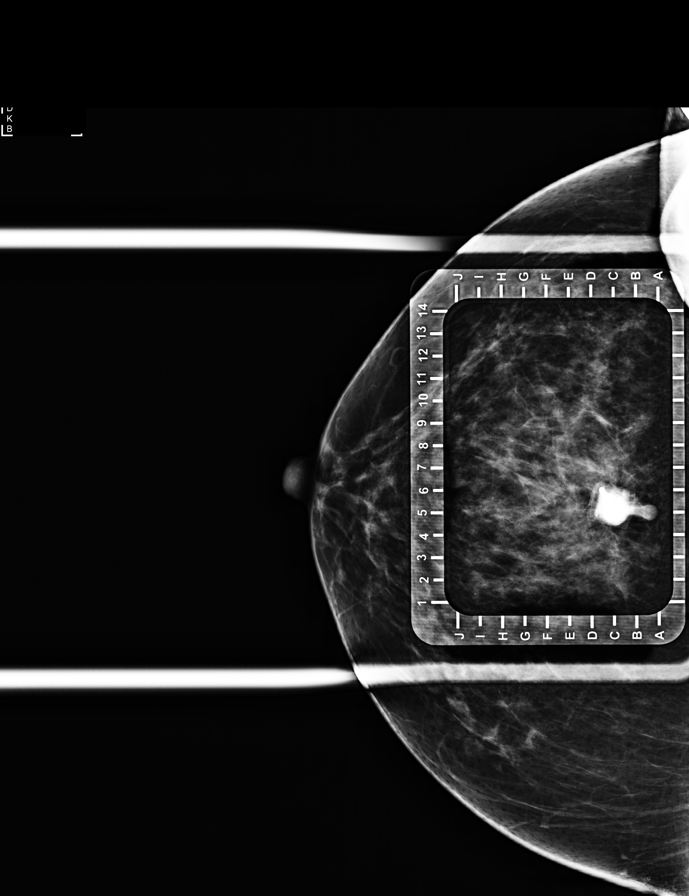

[R ML (2 of 4)]
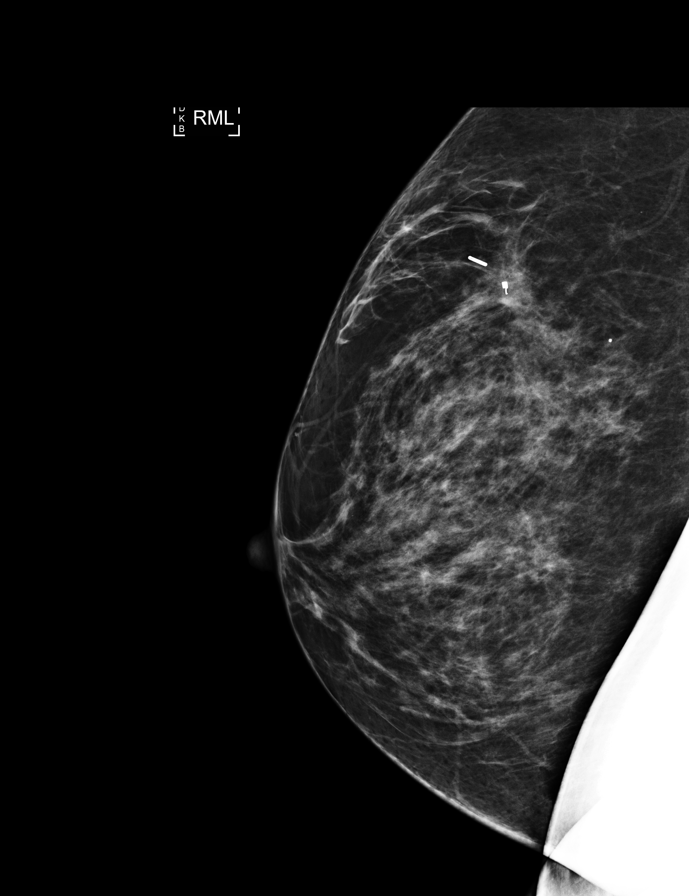

[R CC (2 of 4)]
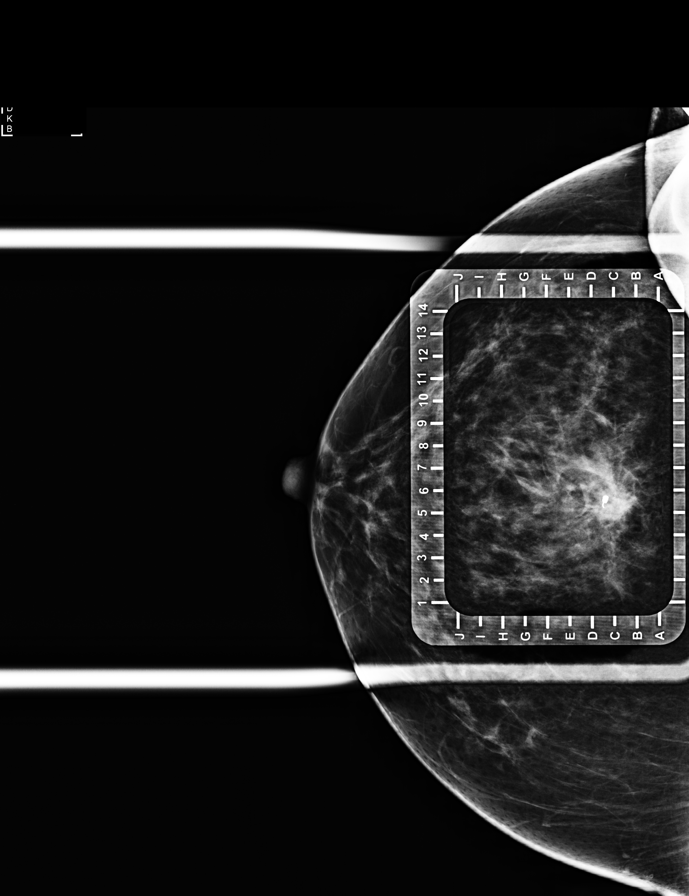

[R CC (3 of 4)]
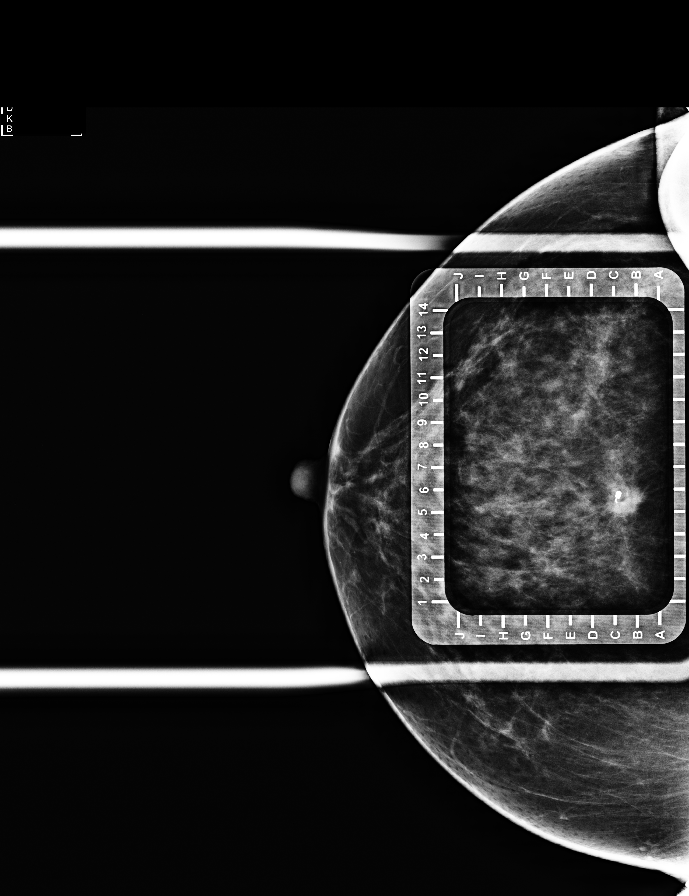

[R ML (3 of 4)]
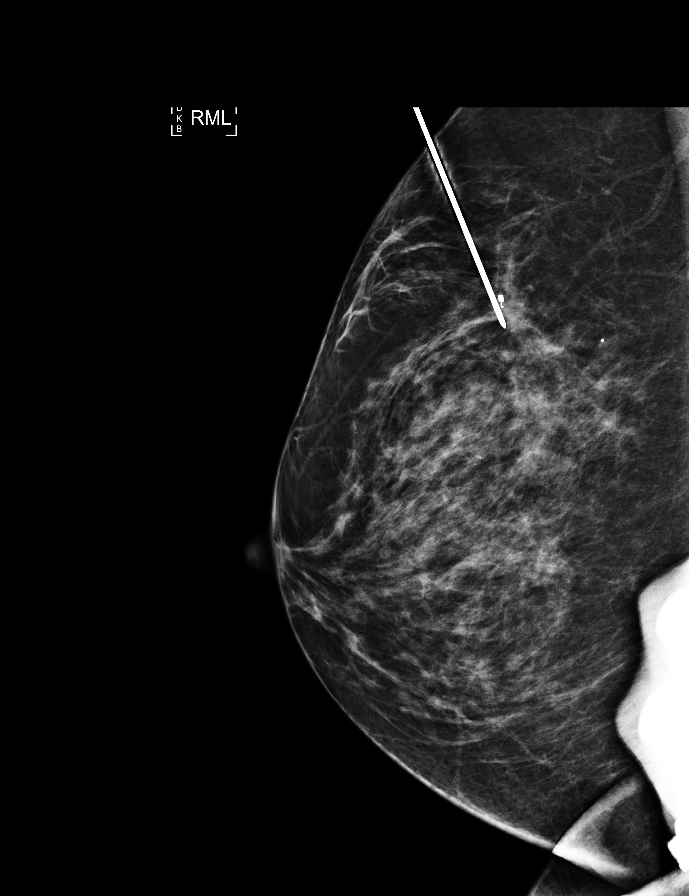

[R CC (4 of 4)]
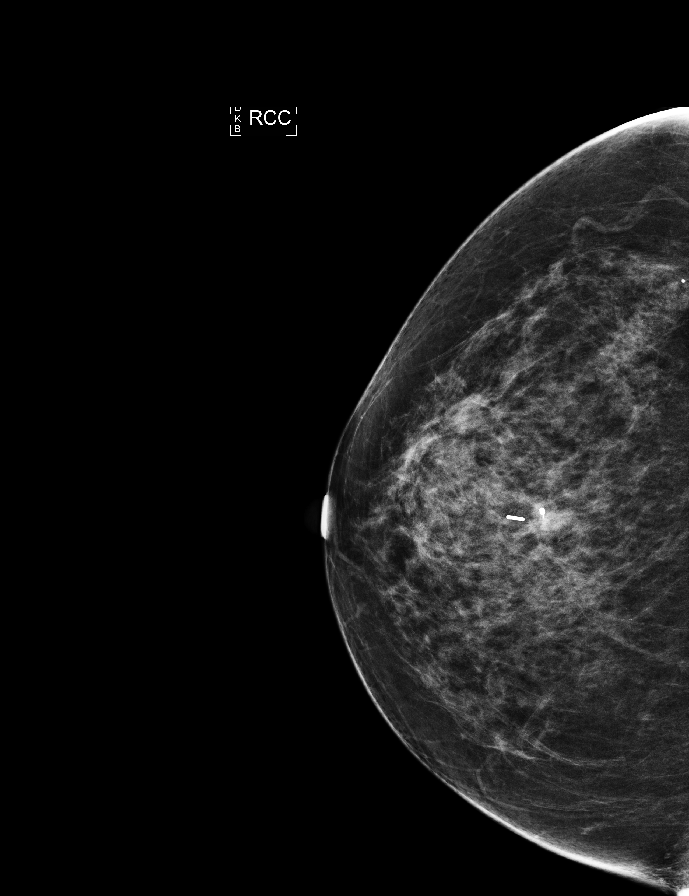

[R ML (4 of 4)]
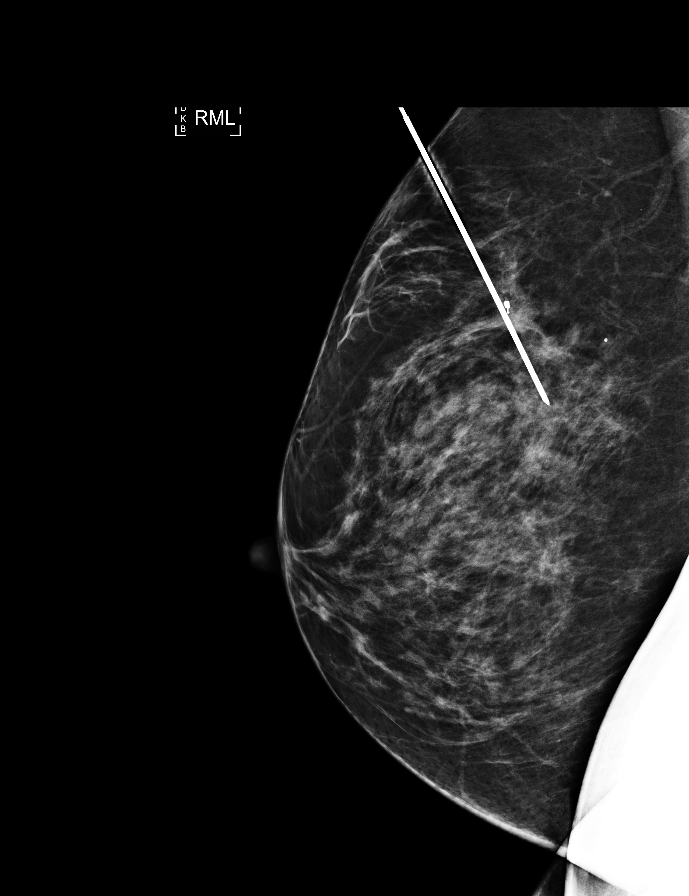

[8 of 8 positions shown; findings below may reference images not displayed]

FINDINGS: Patient presents for radioactive seed localization prior to
excisional biopsy. I met with the patient and we discussed the
procedure of seed localization including benefits and alternatives.
We discussed the high likelihood of a successful procedure. We
discussed the risks of the procedure including infection, bleeding,
tissue injury and further surgery. We discussed the low dose of
radioactivity involved in the procedure. Informed, written consent
was given.

The usual time-out protocol was performed immediately prior to the
procedure.

Using mammographic guidance, sterile technique, 1% lidocaine and an
T-D4Q radioactive seed, the coil shaped clip was localized using a
superior to inferior approach. The follow-up mammogram images
confirm the seed in the expected location and were marked for Dr.
Adenyo.

Follow-up survey of the patient confirms presence of the radioactive
seed.

Order number of T-D4Q seed:  [id number].

Total activity:  0.250 millicuries reference Date: 07/24/2019

The patient tolerated the procedure well and was released from the
[REDACTED]. She was given instructions regarding seed removal.
IMPRESSION: Radioactive seed localization RIGHT breast. No apparent
complications.

## 2022-06-16 DIAGNOSIS — Z20828 Contact with and (suspected) exposure to other viral communicable diseases: Secondary | ICD-10-CM | POA: Diagnosis not present

## 2022-06-16 DIAGNOSIS — Z6822 Body mass index (BMI) 22.0-22.9, adult: Secondary | ICD-10-CM | POA: Diagnosis not present

## 2022-06-16 DIAGNOSIS — R030 Elevated blood-pressure reading, without diagnosis of hypertension: Secondary | ICD-10-CM | POA: Diagnosis not present

## 2022-06-16 DIAGNOSIS — J069 Acute upper respiratory infection, unspecified: Secondary | ICD-10-CM | POA: Diagnosis not present

## 2022-06-18 DIAGNOSIS — Z20828 Contact with and (suspected) exposure to other viral communicable diseases: Secondary | ICD-10-CM | POA: Diagnosis not present

## 2022-07-23 DIAGNOSIS — E039 Hypothyroidism, unspecified: Secondary | ICD-10-CM | POA: Diagnosis not present

## 2022-07-23 DIAGNOSIS — E7801 Familial hypercholesterolemia: Secondary | ICD-10-CM | POA: Diagnosis not present

## 2022-07-23 DIAGNOSIS — E785 Hyperlipidemia, unspecified: Secondary | ICD-10-CM | POA: Diagnosis not present

## 2022-07-23 DIAGNOSIS — E559 Vitamin D deficiency, unspecified: Secondary | ICD-10-CM | POA: Diagnosis not present

## 2022-07-23 DIAGNOSIS — E538 Deficiency of other specified B group vitamins: Secondary | ICD-10-CM | POA: Diagnosis not present

## 2022-07-23 DIAGNOSIS — E119 Type 2 diabetes mellitus without complications: Secondary | ICD-10-CM | POA: Diagnosis not present

## 2022-07-26 DIAGNOSIS — Z6822 Body mass index (BMI) 22.0-22.9, adult: Secondary | ICD-10-CM | POA: Diagnosis not present

## 2022-07-26 DIAGNOSIS — E039 Hypothyroidism, unspecified: Secondary | ICD-10-CM | POA: Diagnosis not present

## 2022-07-26 DIAGNOSIS — R519 Headache, unspecified: Secondary | ICD-10-CM | POA: Diagnosis not present

## 2022-07-26 DIAGNOSIS — I10 Essential (primary) hypertension: Secondary | ICD-10-CM | POA: Diagnosis not present

## 2022-07-26 DIAGNOSIS — E119 Type 2 diabetes mellitus without complications: Secondary | ICD-10-CM | POA: Diagnosis not present

## 2022-07-26 DIAGNOSIS — Z1331 Encounter for screening for depression: Secondary | ICD-10-CM | POA: Diagnosis not present

## 2022-07-26 DIAGNOSIS — E1169 Type 2 diabetes mellitus with other specified complication: Secondary | ICD-10-CM | POA: Diagnosis not present

## 2022-07-26 DIAGNOSIS — Z23 Encounter for immunization: Secondary | ICD-10-CM | POA: Diagnosis not present

## 2022-07-26 DIAGNOSIS — M545 Low back pain, unspecified: Secondary | ICD-10-CM | POA: Diagnosis not present

## 2022-08-16 DIAGNOSIS — H35411 Lattice degeneration of retina, right eye: Secondary | ICD-10-CM | POA: Diagnosis not present

## 2022-08-30 DIAGNOSIS — M810 Age-related osteoporosis without current pathological fracture: Secondary | ICD-10-CM | POA: Diagnosis not present

## 2022-08-30 DIAGNOSIS — M8589 Other specified disorders of bone density and structure, multiple sites: Secondary | ICD-10-CM | POA: Diagnosis not present

## 2022-08-30 DIAGNOSIS — Z13820 Encounter for screening for osteoporosis: Secondary | ICD-10-CM | POA: Diagnosis not present

## 2022-10-18 DIAGNOSIS — H35411 Lattice degeneration of retina, right eye: Secondary | ICD-10-CM | POA: Diagnosis not present

## 2022-10-18 DIAGNOSIS — I129 Hypertensive chronic kidney disease with stage 1 through stage 4 chronic kidney disease, or unspecified chronic kidney disease: Secondary | ICD-10-CM | POA: Diagnosis not present

## 2022-10-18 DIAGNOSIS — E039 Hypothyroidism, unspecified: Secondary | ICD-10-CM | POA: Diagnosis not present

## 2022-10-18 DIAGNOSIS — E1169 Type 2 diabetes mellitus with other specified complication: Secondary | ICD-10-CM | POA: Diagnosis not present

## 2022-10-18 DIAGNOSIS — E7800 Pure hypercholesterolemia, unspecified: Secondary | ICD-10-CM | POA: Diagnosis not present

## 2022-10-18 DIAGNOSIS — E1165 Type 2 diabetes mellitus with hyperglycemia: Secondary | ICD-10-CM | POA: Diagnosis not present

## 2022-10-18 DIAGNOSIS — E7801 Familial hypercholesterolemia: Secondary | ICD-10-CM | POA: Diagnosis not present

## 2022-10-18 DIAGNOSIS — E785 Hyperlipidemia, unspecified: Secondary | ICD-10-CM | POA: Diagnosis not present

## 2022-10-25 DIAGNOSIS — E039 Hypothyroidism, unspecified: Secondary | ICD-10-CM | POA: Diagnosis not present

## 2022-10-25 DIAGNOSIS — E119 Type 2 diabetes mellitus without complications: Secondary | ICD-10-CM | POA: Diagnosis not present

## 2022-10-25 DIAGNOSIS — M25512 Pain in left shoulder: Secondary | ICD-10-CM | POA: Diagnosis not present

## 2022-10-25 DIAGNOSIS — E1169 Type 2 diabetes mellitus with other specified complication: Secondary | ICD-10-CM | POA: Diagnosis not present

## 2022-10-25 DIAGNOSIS — I10 Essential (primary) hypertension: Secondary | ICD-10-CM | POA: Diagnosis not present

## 2022-10-25 DIAGNOSIS — Z6821 Body mass index (BMI) 21.0-21.9, adult: Secondary | ICD-10-CM | POA: Diagnosis not present

## 2022-11-05 DIAGNOSIS — D1730 Benign lipomatous neoplasm of skin and subcutaneous tissue of unspecified sites: Secondary | ICD-10-CM | POA: Diagnosis not present

## 2022-11-05 DIAGNOSIS — L570 Actinic keratosis: Secondary | ICD-10-CM | POA: Diagnosis not present

## 2022-11-05 DIAGNOSIS — Z1283 Encounter for screening for malignant neoplasm of skin: Secondary | ICD-10-CM | POA: Diagnosis not present

## 2022-11-05 DIAGNOSIS — D485 Neoplasm of uncertain behavior of skin: Secondary | ICD-10-CM | POA: Diagnosis not present

## 2022-11-22 DIAGNOSIS — D485 Neoplasm of uncertain behavior of skin: Secondary | ICD-10-CM | POA: Diagnosis not present

## 2022-11-22 DIAGNOSIS — D1722 Benign lipomatous neoplasm of skin and subcutaneous tissue of left arm: Secondary | ICD-10-CM | POA: Diagnosis not present

## 2022-11-22 DIAGNOSIS — Z1231 Encounter for screening mammogram for malignant neoplasm of breast: Secondary | ICD-10-CM | POA: Diagnosis not present

## 2022-11-26 DIAGNOSIS — M25612 Stiffness of left shoulder, not elsewhere classified: Secondary | ICD-10-CM | POA: Diagnosis not present

## 2022-11-26 DIAGNOSIS — M25512 Pain in left shoulder: Secondary | ICD-10-CM | POA: Diagnosis not present

## 2022-11-30 DIAGNOSIS — M25512 Pain in left shoulder: Secondary | ICD-10-CM | POA: Diagnosis not present

## 2022-11-30 DIAGNOSIS — M25612 Stiffness of left shoulder, not elsewhere classified: Secondary | ICD-10-CM | POA: Diagnosis not present

## 2022-12-03 DIAGNOSIS — M25512 Pain in left shoulder: Secondary | ICD-10-CM | POA: Diagnosis not present

## 2022-12-03 DIAGNOSIS — M25612 Stiffness of left shoulder, not elsewhere classified: Secondary | ICD-10-CM | POA: Diagnosis not present

## 2022-12-07 DIAGNOSIS — M25612 Stiffness of left shoulder, not elsewhere classified: Secondary | ICD-10-CM | POA: Diagnosis not present

## 2022-12-07 DIAGNOSIS — M25512 Pain in left shoulder: Secondary | ICD-10-CM | POA: Diagnosis not present

## 2022-12-12 DIAGNOSIS — M25612 Stiffness of left shoulder, not elsewhere classified: Secondary | ICD-10-CM | POA: Diagnosis not present

## 2022-12-12 DIAGNOSIS — M25512 Pain in left shoulder: Secondary | ICD-10-CM | POA: Diagnosis not present

## 2022-12-14 DIAGNOSIS — M25512 Pain in left shoulder: Secondary | ICD-10-CM | POA: Diagnosis not present

## 2022-12-14 DIAGNOSIS — M25612 Stiffness of left shoulder, not elsewhere classified: Secondary | ICD-10-CM | POA: Diagnosis not present

## 2022-12-19 DIAGNOSIS — M25512 Pain in left shoulder: Secondary | ICD-10-CM | POA: Diagnosis not present

## 2022-12-19 DIAGNOSIS — M25612 Stiffness of left shoulder, not elsewhere classified: Secondary | ICD-10-CM | POA: Diagnosis not present

## 2022-12-21 DIAGNOSIS — M25612 Stiffness of left shoulder, not elsewhere classified: Secondary | ICD-10-CM | POA: Diagnosis not present

## 2022-12-21 DIAGNOSIS — M25512 Pain in left shoulder: Secondary | ICD-10-CM | POA: Diagnosis not present

## 2022-12-26 DIAGNOSIS — M25512 Pain in left shoulder: Secondary | ICD-10-CM | POA: Diagnosis not present

## 2022-12-26 DIAGNOSIS — M25612 Stiffness of left shoulder, not elsewhere classified: Secondary | ICD-10-CM | POA: Diagnosis not present

## 2022-12-28 DIAGNOSIS — M25512 Pain in left shoulder: Secondary | ICD-10-CM | POA: Diagnosis not present

## 2022-12-28 DIAGNOSIS — M25612 Stiffness of left shoulder, not elsewhere classified: Secondary | ICD-10-CM | POA: Diagnosis not present

## 2023-01-02 DIAGNOSIS — M25512 Pain in left shoulder: Secondary | ICD-10-CM | POA: Diagnosis not present

## 2023-01-02 DIAGNOSIS — M25612 Stiffness of left shoulder, not elsewhere classified: Secondary | ICD-10-CM | POA: Diagnosis not present

## 2023-01-03 DIAGNOSIS — M25512 Pain in left shoulder: Secondary | ICD-10-CM | POA: Diagnosis not present

## 2023-01-03 DIAGNOSIS — M25612 Stiffness of left shoulder, not elsewhere classified: Secondary | ICD-10-CM | POA: Diagnosis not present

## 2023-01-14 DIAGNOSIS — M25512 Pain in left shoulder: Secondary | ICD-10-CM | POA: Diagnosis not present

## 2023-01-14 DIAGNOSIS — M25612 Stiffness of left shoulder, not elsewhere classified: Secondary | ICD-10-CM | POA: Diagnosis not present

## 2023-01-16 DIAGNOSIS — M25612 Stiffness of left shoulder, not elsewhere classified: Secondary | ICD-10-CM | POA: Diagnosis not present

## 2023-01-16 DIAGNOSIS — M25512 Pain in left shoulder: Secondary | ICD-10-CM | POA: Diagnosis not present

## 2023-01-21 DIAGNOSIS — M25512 Pain in left shoulder: Secondary | ICD-10-CM | POA: Diagnosis not present

## 2023-01-21 DIAGNOSIS — M25612 Stiffness of left shoulder, not elsewhere classified: Secondary | ICD-10-CM | POA: Diagnosis not present

## 2023-01-23 DIAGNOSIS — M25512 Pain in left shoulder: Secondary | ICD-10-CM | POA: Diagnosis not present

## 2023-01-23 DIAGNOSIS — M25612 Stiffness of left shoulder, not elsewhere classified: Secondary | ICD-10-CM | POA: Diagnosis not present

## 2023-01-28 DIAGNOSIS — M25512 Pain in left shoulder: Secondary | ICD-10-CM | POA: Diagnosis not present

## 2023-01-28 DIAGNOSIS — M25612 Stiffness of left shoulder, not elsewhere classified: Secondary | ICD-10-CM | POA: Diagnosis not present

## 2023-01-29 DIAGNOSIS — Z6821 Body mass index (BMI) 21.0-21.9, adult: Secondary | ICD-10-CM | POA: Diagnosis not present

## 2023-01-29 DIAGNOSIS — R030 Elevated blood-pressure reading, without diagnosis of hypertension: Secondary | ICD-10-CM | POA: Diagnosis not present

## 2023-01-29 DIAGNOSIS — M25512 Pain in left shoulder: Secondary | ICD-10-CM | POA: Diagnosis not present

## 2023-02-01 DIAGNOSIS — M25612 Stiffness of left shoulder, not elsewhere classified: Secondary | ICD-10-CM | POA: Diagnosis not present

## 2023-02-01 DIAGNOSIS — M25512 Pain in left shoulder: Secondary | ICD-10-CM | POA: Diagnosis not present

## 2023-02-04 DIAGNOSIS — M25612 Stiffness of left shoulder, not elsewhere classified: Secondary | ICD-10-CM | POA: Diagnosis not present

## 2023-02-04 DIAGNOSIS — M25512 Pain in left shoulder: Secondary | ICD-10-CM | POA: Diagnosis not present

## 2023-02-08 DIAGNOSIS — M25612 Stiffness of left shoulder, not elsewhere classified: Secondary | ICD-10-CM | POA: Diagnosis not present

## 2023-02-08 DIAGNOSIS — M25512 Pain in left shoulder: Secondary | ICD-10-CM | POA: Diagnosis not present

## 2023-02-12 DIAGNOSIS — M25512 Pain in left shoulder: Secondary | ICD-10-CM | POA: Diagnosis not present

## 2023-02-12 DIAGNOSIS — M25612 Stiffness of left shoulder, not elsewhere classified: Secondary | ICD-10-CM | POA: Diagnosis not present

## 2023-02-14 DIAGNOSIS — M25512 Pain in left shoulder: Secondary | ICD-10-CM | POA: Diagnosis not present

## 2023-02-14 DIAGNOSIS — M25612 Stiffness of left shoulder, not elsewhere classified: Secondary | ICD-10-CM | POA: Diagnosis not present

## 2023-02-18 DIAGNOSIS — M25612 Stiffness of left shoulder, not elsewhere classified: Secondary | ICD-10-CM | POA: Diagnosis not present

## 2023-02-18 DIAGNOSIS — M25512 Pain in left shoulder: Secondary | ICD-10-CM | POA: Diagnosis not present

## 2023-02-21 DIAGNOSIS — M25612 Stiffness of left shoulder, not elsewhere classified: Secondary | ICD-10-CM | POA: Diagnosis not present

## 2023-02-21 DIAGNOSIS — M25512 Pain in left shoulder: Secondary | ICD-10-CM | POA: Diagnosis not present

## 2023-02-26 DIAGNOSIS — M25512 Pain in left shoulder: Secondary | ICD-10-CM | POA: Diagnosis not present

## 2023-02-26 DIAGNOSIS — M25612 Stiffness of left shoulder, not elsewhere classified: Secondary | ICD-10-CM | POA: Diagnosis not present

## 2023-03-01 DIAGNOSIS — M25612 Stiffness of left shoulder, not elsewhere classified: Secondary | ICD-10-CM | POA: Diagnosis not present

## 2023-03-01 DIAGNOSIS — M25512 Pain in left shoulder: Secondary | ICD-10-CM | POA: Diagnosis not present

## 2023-03-04 DIAGNOSIS — M25512 Pain in left shoulder: Secondary | ICD-10-CM | POA: Diagnosis not present

## 2023-03-04 DIAGNOSIS — M25612 Stiffness of left shoulder, not elsewhere classified: Secondary | ICD-10-CM | POA: Diagnosis not present

## 2023-03-12 DIAGNOSIS — M25612 Stiffness of left shoulder, not elsewhere classified: Secondary | ICD-10-CM | POA: Diagnosis not present

## 2023-03-12 DIAGNOSIS — M25512 Pain in left shoulder: Secondary | ICD-10-CM | POA: Diagnosis not present

## 2023-03-18 DIAGNOSIS — M25612 Stiffness of left shoulder, not elsewhere classified: Secondary | ICD-10-CM | POA: Diagnosis not present

## 2023-03-18 DIAGNOSIS — M25512 Pain in left shoulder: Secondary | ICD-10-CM | POA: Diagnosis not present

## 2023-03-25 DIAGNOSIS — M25612 Stiffness of left shoulder, not elsewhere classified: Secondary | ICD-10-CM | POA: Diagnosis not present

## 2023-03-25 DIAGNOSIS — M25512 Pain in left shoulder: Secondary | ICD-10-CM | POA: Diagnosis not present

## 2023-04-01 DIAGNOSIS — M25512 Pain in left shoulder: Secondary | ICD-10-CM | POA: Diagnosis not present

## 2023-04-01 DIAGNOSIS — M25612 Stiffness of left shoulder, not elsewhere classified: Secondary | ICD-10-CM | POA: Diagnosis not present

## 2023-04-08 DIAGNOSIS — M25612 Stiffness of left shoulder, not elsewhere classified: Secondary | ICD-10-CM | POA: Diagnosis not present

## 2023-04-08 DIAGNOSIS — M25512 Pain in left shoulder: Secondary | ICD-10-CM | POA: Diagnosis not present

## 2023-04-16 DIAGNOSIS — M25612 Stiffness of left shoulder, not elsewhere classified: Secondary | ICD-10-CM | POA: Diagnosis not present

## 2023-04-16 DIAGNOSIS — M25512 Pain in left shoulder: Secondary | ICD-10-CM | POA: Diagnosis not present

## 2023-04-16 DIAGNOSIS — L570 Actinic keratosis: Secondary | ICD-10-CM | POA: Diagnosis not present

## 2023-04-17 DIAGNOSIS — E7801 Familial hypercholesterolemia: Secondary | ICD-10-CM | POA: Diagnosis not present

## 2023-04-17 DIAGNOSIS — E1165 Type 2 diabetes mellitus with hyperglycemia: Secondary | ICD-10-CM | POA: Diagnosis not present

## 2023-04-17 DIAGNOSIS — E785 Hyperlipidemia, unspecified: Secondary | ICD-10-CM | POA: Diagnosis not present

## 2023-04-17 DIAGNOSIS — I129 Hypertensive chronic kidney disease with stage 1 through stage 4 chronic kidney disease, or unspecified chronic kidney disease: Secondary | ICD-10-CM | POA: Diagnosis not present

## 2023-04-17 DIAGNOSIS — I10 Essential (primary) hypertension: Secondary | ICD-10-CM | POA: Diagnosis not present

## 2023-04-17 DIAGNOSIS — E039 Hypothyroidism, unspecified: Secondary | ICD-10-CM | POA: Diagnosis not present

## 2023-04-23 DIAGNOSIS — E039 Hypothyroidism, unspecified: Secondary | ICD-10-CM | POA: Diagnosis not present

## 2023-04-23 DIAGNOSIS — M25612 Stiffness of left shoulder, not elsewhere classified: Secondary | ICD-10-CM | POA: Diagnosis not present

## 2023-04-23 DIAGNOSIS — M25512 Pain in left shoulder: Secondary | ICD-10-CM | POA: Diagnosis not present

## 2023-04-23 DIAGNOSIS — Z6820 Body mass index (BMI) 20.0-20.9, adult: Secondary | ICD-10-CM | POA: Diagnosis not present

## 2023-04-23 DIAGNOSIS — E119 Type 2 diabetes mellitus without complications: Secondary | ICD-10-CM | POA: Diagnosis not present

## 2023-04-23 DIAGNOSIS — M7502 Adhesive capsulitis of left shoulder: Secondary | ICD-10-CM | POA: Diagnosis not present

## 2023-04-23 DIAGNOSIS — I10 Essential (primary) hypertension: Secondary | ICD-10-CM | POA: Diagnosis not present

## 2023-04-23 DIAGNOSIS — E1169 Type 2 diabetes mellitus with other specified complication: Secondary | ICD-10-CM | POA: Diagnosis not present

## 2023-04-26 DIAGNOSIS — M25612 Stiffness of left shoulder, not elsewhere classified: Secondary | ICD-10-CM | POA: Diagnosis not present

## 2023-04-26 DIAGNOSIS — M25512 Pain in left shoulder: Secondary | ICD-10-CM | POA: Diagnosis not present

## 2023-05-20 DIAGNOSIS — M25612 Stiffness of left shoulder, not elsewhere classified: Secondary | ICD-10-CM | POA: Diagnosis not present

## 2023-05-20 DIAGNOSIS — M25512 Pain in left shoulder: Secondary | ICD-10-CM | POA: Diagnosis not present

## 2023-05-27 DIAGNOSIS — M25512 Pain in left shoulder: Secondary | ICD-10-CM | POA: Diagnosis not present

## 2023-05-27 DIAGNOSIS — M25612 Stiffness of left shoulder, not elsewhere classified: Secondary | ICD-10-CM | POA: Diagnosis not present

## 2023-05-30 DIAGNOSIS — M25512 Pain in left shoulder: Secondary | ICD-10-CM | POA: Diagnosis not present

## 2023-05-30 DIAGNOSIS — M25612 Stiffness of left shoulder, not elsewhere classified: Secondary | ICD-10-CM | POA: Diagnosis not present

## 2023-06-03 DIAGNOSIS — M25512 Pain in left shoulder: Secondary | ICD-10-CM | POA: Diagnosis not present

## 2023-06-03 DIAGNOSIS — M25612 Stiffness of left shoulder, not elsewhere classified: Secondary | ICD-10-CM | POA: Diagnosis not present

## 2023-06-24 DIAGNOSIS — M25612 Stiffness of left shoulder, not elsewhere classified: Secondary | ICD-10-CM | POA: Diagnosis not present

## 2023-06-24 DIAGNOSIS — M25512 Pain in left shoulder: Secondary | ICD-10-CM | POA: Diagnosis not present

## 2023-07-01 DIAGNOSIS — M25612 Stiffness of left shoulder, not elsewhere classified: Secondary | ICD-10-CM | POA: Diagnosis not present

## 2023-07-01 DIAGNOSIS — M25512 Pain in left shoulder: Secondary | ICD-10-CM | POA: Diagnosis not present

## 2023-07-04 DIAGNOSIS — M25612 Stiffness of left shoulder, not elsewhere classified: Secondary | ICD-10-CM | POA: Diagnosis not present

## 2023-07-04 DIAGNOSIS — M25512 Pain in left shoulder: Secondary | ICD-10-CM | POA: Diagnosis not present

## 2023-07-10 DIAGNOSIS — M25612 Stiffness of left shoulder, not elsewhere classified: Secondary | ICD-10-CM | POA: Diagnosis not present

## 2023-07-10 DIAGNOSIS — M25512 Pain in left shoulder: Secondary | ICD-10-CM | POA: Diagnosis not present

## 2023-07-19 DIAGNOSIS — M25612 Stiffness of left shoulder, not elsewhere classified: Secondary | ICD-10-CM | POA: Diagnosis not present

## 2023-07-19 DIAGNOSIS — M25512 Pain in left shoulder: Secondary | ICD-10-CM | POA: Diagnosis not present

## 2023-07-22 DIAGNOSIS — M25512 Pain in left shoulder: Secondary | ICD-10-CM | POA: Diagnosis not present

## 2023-07-22 DIAGNOSIS — M25612 Stiffness of left shoulder, not elsewhere classified: Secondary | ICD-10-CM | POA: Diagnosis not present

## 2023-07-29 DIAGNOSIS — M25512 Pain in left shoulder: Secondary | ICD-10-CM | POA: Diagnosis not present

## 2023-07-29 DIAGNOSIS — M25612 Stiffness of left shoulder, not elsewhere classified: Secondary | ICD-10-CM | POA: Diagnosis not present

## 2023-08-02 DIAGNOSIS — M7502 Adhesive capsulitis of left shoulder: Secondary | ICD-10-CM | POA: Diagnosis not present

## 2023-08-02 DIAGNOSIS — Z6821 Body mass index (BMI) 21.0-21.9, adult: Secondary | ICD-10-CM | POA: Diagnosis not present

## 2023-08-05 DIAGNOSIS — M25512 Pain in left shoulder: Secondary | ICD-10-CM | POA: Diagnosis not present

## 2023-08-05 DIAGNOSIS — M25612 Stiffness of left shoulder, not elsewhere classified: Secondary | ICD-10-CM | POA: Diagnosis not present

## 2023-08-07 DIAGNOSIS — M25612 Stiffness of left shoulder, not elsewhere classified: Secondary | ICD-10-CM | POA: Diagnosis not present

## 2023-08-07 DIAGNOSIS — M25512 Pain in left shoulder: Secondary | ICD-10-CM | POA: Diagnosis not present

## 2023-08-07 DIAGNOSIS — M6281 Muscle weakness (generalized): Secondary | ICD-10-CM | POA: Diagnosis not present

## 2023-08-12 DIAGNOSIS — M25512 Pain in left shoulder: Secondary | ICD-10-CM | POA: Diagnosis not present

## 2023-08-12 DIAGNOSIS — M25612 Stiffness of left shoulder, not elsewhere classified: Secondary | ICD-10-CM | POA: Diagnosis not present

## 2023-08-12 DIAGNOSIS — M6281 Muscle weakness (generalized): Secondary | ICD-10-CM | POA: Diagnosis not present

## 2023-08-19 DIAGNOSIS — M25612 Stiffness of left shoulder, not elsewhere classified: Secondary | ICD-10-CM | POA: Diagnosis not present

## 2023-08-19 DIAGNOSIS — M6281 Muscle weakness (generalized): Secondary | ICD-10-CM | POA: Diagnosis not present

## 2023-08-19 DIAGNOSIS — M25512 Pain in left shoulder: Secondary | ICD-10-CM | POA: Diagnosis not present

## 2023-08-23 DIAGNOSIS — M25512 Pain in left shoulder: Secondary | ICD-10-CM | POA: Diagnosis not present

## 2023-08-23 DIAGNOSIS — M25612 Stiffness of left shoulder, not elsewhere classified: Secondary | ICD-10-CM | POA: Diagnosis not present

## 2023-08-26 DIAGNOSIS — M6281 Muscle weakness (generalized): Secondary | ICD-10-CM | POA: Diagnosis not present

## 2023-08-26 DIAGNOSIS — M25612 Stiffness of left shoulder, not elsewhere classified: Secondary | ICD-10-CM | POA: Diagnosis not present

## 2023-08-26 DIAGNOSIS — M25512 Pain in left shoulder: Secondary | ICD-10-CM | POA: Diagnosis not present

## 2023-08-29 DIAGNOSIS — Z6822 Body mass index (BMI) 22.0-22.9, adult: Secondary | ICD-10-CM | POA: Diagnosis not present

## 2023-08-29 DIAGNOSIS — R300 Dysuria: Secondary | ICD-10-CM | POA: Diagnosis not present

## 2023-08-29 DIAGNOSIS — R350 Frequency of micturition: Secondary | ICD-10-CM | POA: Diagnosis not present

## 2023-08-29 DIAGNOSIS — H35411 Lattice degeneration of retina, right eye: Secondary | ICD-10-CM | POA: Diagnosis not present

## 2023-08-30 DIAGNOSIS — M25612 Stiffness of left shoulder, not elsewhere classified: Secondary | ICD-10-CM | POA: Diagnosis not present

## 2023-08-30 DIAGNOSIS — M25512 Pain in left shoulder: Secondary | ICD-10-CM | POA: Diagnosis not present

## 2023-09-02 DIAGNOSIS — M25612 Stiffness of left shoulder, not elsewhere classified: Secondary | ICD-10-CM | POA: Diagnosis not present

## 2023-09-02 DIAGNOSIS — M25512 Pain in left shoulder: Secondary | ICD-10-CM | POA: Diagnosis not present

## 2023-09-04 DIAGNOSIS — M25512 Pain in left shoulder: Secondary | ICD-10-CM | POA: Diagnosis not present

## 2023-09-04 DIAGNOSIS — M25612 Stiffness of left shoulder, not elsewhere classified: Secondary | ICD-10-CM | POA: Diagnosis not present

## 2023-09-09 DIAGNOSIS — M25612 Stiffness of left shoulder, not elsewhere classified: Secondary | ICD-10-CM | POA: Diagnosis not present

## 2023-09-09 DIAGNOSIS — M25512 Pain in left shoulder: Secondary | ICD-10-CM | POA: Diagnosis not present

## 2023-09-16 DIAGNOSIS — M25512 Pain in left shoulder: Secondary | ICD-10-CM | POA: Diagnosis not present

## 2023-09-16 DIAGNOSIS — M25612 Stiffness of left shoulder, not elsewhere classified: Secondary | ICD-10-CM | POA: Diagnosis not present

## 2023-09-23 DIAGNOSIS — M25512 Pain in left shoulder: Secondary | ICD-10-CM | POA: Diagnosis not present

## 2023-09-23 DIAGNOSIS — M25612 Stiffness of left shoulder, not elsewhere classified: Secondary | ICD-10-CM | POA: Diagnosis not present

## 2023-10-02 DIAGNOSIS — M25612 Stiffness of left shoulder, not elsewhere classified: Secondary | ICD-10-CM | POA: Diagnosis not present

## 2023-10-02 DIAGNOSIS — M25512 Pain in left shoulder: Secondary | ICD-10-CM | POA: Diagnosis not present

## 2023-10-07 DIAGNOSIS — M25512 Pain in left shoulder: Secondary | ICD-10-CM | POA: Diagnosis not present

## 2023-10-07 DIAGNOSIS — M25612 Stiffness of left shoulder, not elsewhere classified: Secondary | ICD-10-CM | POA: Diagnosis not present

## 2023-10-16 DIAGNOSIS — I10 Essential (primary) hypertension: Secondary | ICD-10-CM | POA: Diagnosis not present

## 2023-10-16 DIAGNOSIS — E1122 Type 2 diabetes mellitus with diabetic chronic kidney disease: Secondary | ICD-10-CM | POA: Diagnosis not present

## 2023-10-16 DIAGNOSIS — E785 Hyperlipidemia, unspecified: Secondary | ICD-10-CM | POA: Diagnosis not present

## 2023-10-16 DIAGNOSIS — E039 Hypothyroidism, unspecified: Secondary | ICD-10-CM | POA: Diagnosis not present

## 2023-10-23 DIAGNOSIS — E1165 Type 2 diabetes mellitus with hyperglycemia: Secondary | ICD-10-CM | POA: Diagnosis not present

## 2023-10-25 DIAGNOSIS — H2513 Age-related nuclear cataract, bilateral: Secondary | ICD-10-CM | POA: Diagnosis not present

## 2023-11-01 DIAGNOSIS — M25612 Stiffness of left shoulder, not elsewhere classified: Secondary | ICD-10-CM | POA: Diagnosis not present

## 2023-11-01 DIAGNOSIS — M25512 Pain in left shoulder: Secondary | ICD-10-CM | POA: Diagnosis not present

## 2023-11-05 DIAGNOSIS — I781 Nevus, non-neoplastic: Secondary | ICD-10-CM | POA: Diagnosis not present

## 2023-11-05 DIAGNOSIS — D1801 Hemangioma of skin and subcutaneous tissue: Secondary | ICD-10-CM | POA: Diagnosis not present

## 2023-11-05 DIAGNOSIS — L573 Poikiloderma of Civatte: Secondary | ICD-10-CM | POA: Diagnosis not present

## 2023-11-13 DIAGNOSIS — M25612 Stiffness of left shoulder, not elsewhere classified: Secondary | ICD-10-CM | POA: Diagnosis not present

## 2023-11-13 DIAGNOSIS — M25512 Pain in left shoulder: Secondary | ICD-10-CM | POA: Diagnosis not present

## 2023-11-22 DIAGNOSIS — M25512 Pain in left shoulder: Secondary | ICD-10-CM | POA: Diagnosis not present

## 2023-11-22 DIAGNOSIS — M25612 Stiffness of left shoulder, not elsewhere classified: Secondary | ICD-10-CM | POA: Diagnosis not present

## 2023-11-26 DIAGNOSIS — Z1231 Encounter for screening mammogram for malignant neoplasm of breast: Secondary | ICD-10-CM | POA: Diagnosis not present

## 2024-02-03 DIAGNOSIS — L570 Actinic keratosis: Secondary | ICD-10-CM | POA: Diagnosis not present
